# Patient Record
Sex: Female | Born: 1980 | Race: White | Hispanic: No | Marital: Married | Smoking: Never smoker
Health system: Southern US, Community
[De-identification: ages and names within clinical notes are randomized; demographics above are authoritative.]

---

## 1999-07-29 ENCOUNTER — Other Ambulatory Visit: Admission: RE | Admit: 1999-07-29 | Discharge: 1999-07-29 | Payer: Self-pay | Admitting: *Deleted

## 2000-08-05 ENCOUNTER — Other Ambulatory Visit: Admission: RE | Admit: 2000-08-05 | Discharge: 2000-08-05 | Payer: Self-pay | Admitting: Obstetrics and Gynecology

## 2003-01-10 ENCOUNTER — Other Ambulatory Visit: Admission: RE | Admit: 2003-01-10 | Discharge: 2003-01-10 | Payer: Self-pay | Admitting: Obstetrics and Gynecology

## 2016-10-07 ENCOUNTER — Other Ambulatory Visit: Payer: Self-pay | Admitting: Family Medicine

## 2016-10-07 ENCOUNTER — Ambulatory Visit
Admission: RE | Admit: 2016-10-07 | Discharge: 2016-10-07 | Disposition: A | Discharge status: Home / Self Care | Payer: BC Managed Care – PPO | Source: Ambulatory Visit | Attending: Family Medicine | Admitting: Family Medicine

## 2016-10-07 DIAGNOSIS — M79632 Pain in left forearm: Secondary | ICD-10-CM

## 2017-11-29 ENCOUNTER — Ambulatory Visit: Payer: BC Managed Care – PPO | Admitting: Psychology

## 2017-11-29 DIAGNOSIS — F411 Generalized anxiety disorder: Secondary | ICD-10-CM | POA: Diagnosis not present

## 2017-12-13 ENCOUNTER — Ambulatory Visit: Payer: BC Managed Care – PPO | Admitting: Psychology

## 2017-12-13 DIAGNOSIS — F411 Generalized anxiety disorder: Secondary | ICD-10-CM | POA: Diagnosis not present

## 2018-01-10 ENCOUNTER — Ambulatory Visit: Payer: BC Managed Care – PPO | Admitting: Psychology

## 2018-01-10 DIAGNOSIS — F411 Generalized anxiety disorder: Secondary | ICD-10-CM | POA: Diagnosis not present

## 2018-02-07 ENCOUNTER — Ambulatory Visit: Payer: BC Managed Care – PPO | Admitting: Psychology

## 2018-02-07 DIAGNOSIS — F411 Generalized anxiety disorder: Secondary | ICD-10-CM

## 2018-03-21 ENCOUNTER — Ambulatory Visit: Payer: BC Managed Care – PPO | Admitting: Psychology

## 2018-03-21 DIAGNOSIS — F411 Generalized anxiety disorder: Secondary | ICD-10-CM | POA: Diagnosis not present

## 2018-04-04 ENCOUNTER — Ambulatory Visit: Payer: BC Managed Care – PPO | Admitting: Psychology

## 2018-05-16 ENCOUNTER — Ambulatory Visit: Payer: BC Managed Care – PPO | Admitting: Psychology

## 2018-05-16 DIAGNOSIS — F411 Generalized anxiety disorder: Secondary | ICD-10-CM | POA: Diagnosis not present

## 2018-06-27 ENCOUNTER — Ambulatory Visit: Payer: BC Managed Care – PPO | Admitting: Psychology

## 2018-06-27 DIAGNOSIS — F411 Generalized anxiety disorder: Secondary | ICD-10-CM

## 2018-08-08 ENCOUNTER — Ambulatory Visit (INDEPENDENT_AMBULATORY_CARE_PROVIDER_SITE_OTHER): Payer: BC Managed Care – PPO | Admitting: Psychology

## 2018-08-08 DIAGNOSIS — F411 Generalized anxiety disorder: Secondary | ICD-10-CM | POA: Diagnosis not present

## 2018-09-06 ENCOUNTER — Ambulatory Visit (INDEPENDENT_AMBULATORY_CARE_PROVIDER_SITE_OTHER): Payer: BC Managed Care – PPO | Admitting: Psychology

## 2018-09-06 DIAGNOSIS — F411 Generalized anxiety disorder: Secondary | ICD-10-CM

## 2018-10-11 ENCOUNTER — Ambulatory Visit (INDEPENDENT_AMBULATORY_CARE_PROVIDER_SITE_OTHER): Payer: BC Managed Care – PPO | Admitting: Psychology

## 2018-10-11 DIAGNOSIS — F411 Generalized anxiety disorder: Secondary | ICD-10-CM | POA: Diagnosis not present

## 2019-04-17 ENCOUNTER — Ambulatory Visit: Payer: BC Managed Care – PPO | Attending: Internal Medicine

## 2019-04-17 DIAGNOSIS — Z20822 Contact with and (suspected) exposure to covid-19: Secondary | ICD-10-CM

## 2019-04-18 LAB — NOVEL CORONAVIRUS, NAA: SARS-CoV-2, NAA: NOT DETECTED

## 2019-06-07 ENCOUNTER — Ambulatory Visit: Payer: BC Managed Care – PPO | Attending: Internal Medicine

## 2019-06-07 DIAGNOSIS — Z20822 Contact with and (suspected) exposure to covid-19: Secondary | ICD-10-CM

## 2019-06-08 LAB — NOVEL CORONAVIRUS, NAA: SARS-CoV-2, NAA: NOT DETECTED

## 2019-06-10 ENCOUNTER — Ambulatory Visit: Payer: BC Managed Care – PPO

## 2019-06-12 ENCOUNTER — Ambulatory Visit: Payer: BC Managed Care – PPO | Attending: Internal Medicine

## 2019-06-12 DIAGNOSIS — Z20822 Contact with and (suspected) exposure to covid-19: Secondary | ICD-10-CM

## 2019-06-13 LAB — NOVEL CORONAVIRUS, NAA: SARS-CoV-2, NAA: NOT DETECTED

## 2020-10-17 ENCOUNTER — Other Ambulatory Visit: Payer: Self-pay | Admitting: Family Medicine

## 2020-10-17 DIAGNOSIS — N6320 Unspecified lump in the left breast, unspecified quadrant: Secondary | ICD-10-CM

## 2020-11-13 ENCOUNTER — Other Ambulatory Visit: Payer: Self-pay

## 2020-11-13 ENCOUNTER — Ambulatory Visit
Admission: RE | Admit: 2020-11-13 | Discharge: 2020-11-13 | Disposition: A | Discharge status: Home / Self Care | Payer: BC Managed Care – PPO | Source: Ambulatory Visit | Attending: Family Medicine | Admitting: Family Medicine

## 2020-11-13 DIAGNOSIS — N6320 Unspecified lump in the left breast, unspecified quadrant: Secondary | ICD-10-CM

## 2020-11-24 ENCOUNTER — Other Ambulatory Visit: Payer: Self-pay

## 2020-11-24 ENCOUNTER — Ambulatory Visit
Admission: EM | Admit: 2020-11-24 | Discharge: 2020-11-24 | Disposition: A | Discharge status: Home / Self Care | Payer: BC Managed Care – PPO

## 2020-11-24 DIAGNOSIS — L739 Follicular disorder, unspecified: Secondary | ICD-10-CM | POA: Diagnosis not present

## 2020-11-24 DIAGNOSIS — L080 Pyoderma: Secondary | ICD-10-CM

## 2020-11-24 NOTE — ED Triage Notes (Signed)
Pt c/o small red bumps with white spot in middle. States started approx 1 week ago on hands and has progressed to face, chest, and back. States the white spots come and go. States has tried cleansing with alcohol swabs and Claritin without relief. States traveled to [place] July 6-8 but denies contact with known monkeypox pt. Denies pain and itching.

## 2020-11-24 NOTE — ED Provider Notes (Signed)
[facility]    CSN: [id number] Arrival date & time: 11/24/20  0954      History   Chief Complaint Chief Complaint  Patient presents with   generalized bumps    HPI Kaitlyn Ross is a 40 y.o. female.   Patient presents today with a 1 week history of pustular rash.  She did have a video visit with her primary care provider who prescribed Keflex but she has not yet started this medication.  Reports symptoms are generalized and located on bilateral upper extremities, thoracic back, face.  She has been using over-the-counter washes without improvement of symptoms.  She denies history of similar symptoms in the past.  Denies any prodromal symptoms including fever, nausea, vomiting, rhinorrhea, cough.  Denies any known sick contacts with similar symptoms or exposure to any new products.  She did travel to [place] several weeks prior to symptom onset but denies any international travel.  Denies any known exposure to monkey pox.   History reviewed. No pertinent past medical history.  There are no problems to display for this patient.   History reviewed. No pertinent surgical history.  OB History   No obstetric history on file.      Home Medications    Prior to Admission medications   Not on File    Family History History reviewed. No pertinent family history.  Social History Social History   Tobacco Use   Smoking status: Never   Smokeless tobacco: Never  Substance Use Topics   Alcohol use: Never   Drug use: Never     Allergies   Bee venom   Review of Systems Review of Systems  Constitutional:  Negative for activity change, appetite change, fatigue and fever.  Respiratory:  Negative for cough and shortness of breath.   Cardiovascular:  Negative for chest pain.  Gastrointestinal:  Negative for abdominal pain, diarrhea, nausea and vomiting.  Skin:  Positive for rash.  Neurological:  Negative for dizziness, light-headedness and headaches.     Physical Exam Triage Vital Signs ED Triage Vitals [11/24/20 1123]  Enc Vitals Group     BP 106/71     Pulse Rate 89     Resp 18     Temp 98.1 F (36.7 C)     Temp Source Oral     SpO2 98 %     Weight      Height      Head Circumference      Peak Flow      Pain Score 0     Pain Loc      Pain Edu?      Excl. in GC?    No data found.  Updated Vital Signs BP 106/71 (BP Location: Right Arm)   Pulse 89   Temp 98.1 F (36.7 C) (Oral)   Resp 18   LMP 10/25/2020 (Approximate)   SpO2 98%   Visual Acuity Right Eye Distance:   Left Eye Distance:   Bilateral Distance:    Right Eye Near:   Left Eye Near:    Bilateral Near:     Physical Exam Vitals reviewed.  Constitutional:      General: She is awake. She is not in acute distress.    Appearance: Normal appearance. She is normal weight. She is not ill-appearing.     Comments: Very pleasant female appears stated age no acute distress  HENT:     Head: Normocephalic and atraumatic.  Cardiovascular:     Rate and  Rhythm: Normal rate and regular rhythm.     Heart sounds: Normal heart sounds, S1 normal and S2 normal. No murmur heard. Pulmonary:     Effort: Pulmonary effort is normal.     Breath sounds: Normal breath sounds. No wheezing, rhonchi or rales.  Skin:    General: Skin is warm.     Findings: Rash present. Rash is pustular.     Comments: Several fine pustules noted upper extremities and back.  No plaque-like lesions or vesicles noted.  No erythema or streaking indicative of lymphangitis.  Psychiatric:        Behavior: Behavior is cooperative.     UC Treatments / Results  Labs (all labs ordered are listed, but only abnormal results are displayed) Labs Reviewed - No data to display  EKG   Radiology No results found.  Procedures Procedures (including critical care time)  Medications Ordered in UC Medications - No data to display  Initial Impression / Assessment and Plan / UC Course  I have  reviewed the triage vital signs and the nursing notes.  Pertinent labs & imaging results that were available during my care of the patient were reviewed by me and considered in my medical decision making (see chart for details).      Symptoms consistent with folliculitis.  Recommend she begin antibiotics prescribed by her primary care provider.  Discussed that based on her recommendation she does not meet qualifications for state testing.  She did then inquire about vaccination I discussed that this is not something we have available and at this point is reserved for individuals with known exposure but she can contact local health department with additional questions.  Discussed that since she has no risk factors or prodromal symptoms and rashes not consistent with polyps there is no indication for additional testing.  Discussed alarm symptoms that warrant emergent evaluation.  Strict return precautions given to which patient expressed understanding. Final Clinical Impressions(s) / UC Diagnoses   Final diagnoses:  Folliculitis  Pustular rash     Discharge Instructions      Take Keflex that you are prescribed by your primary care provider.  If you develop widespread lesions or additional symptoms please return for reevaluation.  Based on your symptoms and risk factors there is no indication to test you for Monkey Pox today but if anything changes please return for reevaluation.     ED Prescriptions   None    PDMP not reviewed this encounter.   Jeani Hawking, PA-C 11/24/20 1233

## 2020-11-24 NOTE — Discharge Instructions (Addendum)
Take Keflex that you are prescribed by your primary care provider.  If you develop widespread lesions or additional symptoms please return for reevaluation.  Based on your symptoms and risk factors there is no indication to test you for Monkey Pox today but if anything changes please return for reevaluation.

## 2020-12-04 ENCOUNTER — Ambulatory Visit: Payer: BC Managed Care – PPO

## 2021-02-14 ENCOUNTER — Other Ambulatory Visit: Payer: Self-pay

## 2021-02-14 ENCOUNTER — Ambulatory Visit
Admission: RE | Admit: 2021-02-14 | Discharge: 2021-02-14 | Disposition: A | Discharge status: Home / Self Care | Payer: BC Managed Care – PPO | Source: Ambulatory Visit | Attending: Nurse Practitioner | Admitting: Nurse Practitioner

## 2021-02-14 ENCOUNTER — Other Ambulatory Visit: Payer: Self-pay | Admitting: Nurse Practitioner

## 2021-02-14 DIAGNOSIS — R0789 Other chest pain: Secondary | ICD-10-CM

## 2021-05-05 ENCOUNTER — Other Ambulatory Visit: Payer: Self-pay | Admitting: Family Medicine

## 2021-05-08 ENCOUNTER — Other Ambulatory Visit: Payer: Self-pay | Admitting: Family Medicine

## 2021-05-08 DIAGNOSIS — N6320 Unspecified lump in the left breast, unspecified quadrant: Secondary | ICD-10-CM

## 2021-06-12 ENCOUNTER — Other Ambulatory Visit: Payer: Self-pay | Admitting: Nurse Practitioner

## 2021-06-12 DIAGNOSIS — N6019 Diffuse cystic mastopathy of unspecified breast: Secondary | ICD-10-CM

## 2021-06-12 DIAGNOSIS — Z803 Family history of malignant neoplasm of breast: Secondary | ICD-10-CM

## 2021-06-12 DIAGNOSIS — N6320 Unspecified lump in the left breast, unspecified quadrant: Secondary | ICD-10-CM

## 2021-07-02 ENCOUNTER — Ambulatory Visit
Admission: RE | Admit: 2021-07-02 | Discharge: 2021-07-02 | Disposition: A | Discharge status: Home / Self Care | Payer: No Typology Code available for payment source | Source: Ambulatory Visit | Attending: Nurse Practitioner | Admitting: Nurse Practitioner

## 2021-07-02 ENCOUNTER — Other Ambulatory Visit: Payer: Self-pay

## 2021-07-02 DIAGNOSIS — Z803 Family history of malignant neoplasm of breast: Secondary | ICD-10-CM

## 2021-07-02 DIAGNOSIS — N6320 Unspecified lump in the left breast, unspecified quadrant: Secondary | ICD-10-CM

## 2021-07-02 DIAGNOSIS — N6019 Diffuse cystic mastopathy of unspecified breast: Secondary | ICD-10-CM

## 2021-07-02 MED ORDER — GADOBUTROL 1 MMOL/ML IV SOLN
6.0000 mL | Freq: Once | INTRAVENOUS | Status: AC | PRN
  Administered 2021-07-02: 6 mL via INTRAVENOUS

## 2021-11-04 ENCOUNTER — Other Ambulatory Visit: Payer: Self-pay

## 2021-11-04 ENCOUNTER — Emergency Department (HOSPITAL_BASED_OUTPATIENT_CLINIC_OR_DEPARTMENT_OTHER): Payer: BC Managed Care – PPO

## 2021-11-04 ENCOUNTER — Encounter (HOSPITAL_BASED_OUTPATIENT_CLINIC_OR_DEPARTMENT_OTHER): Payer: Self-pay | Admitting: Emergency Medicine

## 2021-11-04 ENCOUNTER — Emergency Department (HOSPITAL_BASED_OUTPATIENT_CLINIC_OR_DEPARTMENT_OTHER)
Admission: EM | Admit: 2021-11-04 | Discharge: 2021-11-04 | Disposition: A | Discharge status: Home / Self Care | Payer: BC Managed Care – PPO | Attending: Emergency Medicine | Admitting: Emergency Medicine

## 2021-11-04 DIAGNOSIS — X18XXXA Contact with other hot metals, initial encounter: Secondary | ICD-10-CM | POA: Insufficient documentation

## 2021-11-04 DIAGNOSIS — S99921A Unspecified injury of right foot, initial encounter: Secondary | ICD-10-CM

## 2021-11-04 DIAGNOSIS — S91114A Laceration without foreign body of right lesser toe(s) without damage to nail, initial encounter: Secondary | ICD-10-CM | POA: Insufficient documentation

## 2021-11-04 DIAGNOSIS — T148XXA Other injury of unspecified body region, initial encounter: Secondary | ICD-10-CM

## 2021-11-04 NOTE — ED Triage Notes (Signed)
Pt right foot was cut by a metal door at the dentist office.

## 2021-11-04 NOTE — Discharge Instructions (Signed)
Your history, exam and work-up today showed evidence of abrasion/small laceration without evidence of fracture on the imaging.  With our shared decision-making conversation, we agreed to do skin glue instead of sutures and like to manage it at home.  Please watch for signs and symptoms of infection and avoid submersion in water.  If any symptoms change or worsen acutely, please return to the nearest emergency department.

## 2021-11-04 NOTE — ED Provider Notes (Signed)
[facility] EMERGENCY DEPT Provider Note   CSN: [id number] Arrival date & time: 11/04/21  6945     History  Chief Complaint  Patient presents with   Foot Injury    Kaitlyn Ross is a 41 y.o. female.  The history is provided by the patient and medical records. No language interpreter was used.  Foot Injury Location:  Foot Time since incident:  1 hour Injury: yes   Foot location:  Dorsum of R foot Pain details:    Radiates to:  Does not radiate   Severity:  No pain   Timing:  Unable to specify   Progression:  Unable to specify Chronicity:  New Foreign body present:  Unable to specify Tetanus status:  Up to date Prior injury to area:  No Relieved by:  Nothing Worsened by:  Nothing Ineffective treatments:  None tried Associated symptoms: no back pain, no fatigue and no fever        Home Medications Prior to Admission medications   Not on File      Allergies    Bee venom    Review of Systems   Review of Systems  Constitutional:  Negative for chills, diaphoresis, fatigue and fever.  HENT:  Negative for congestion.   Respiratory:  Negative for cough, chest tightness and shortness of breath.   Cardiovascular:  Negative for chest pain.  Gastrointestinal:  Negative for diarrhea and nausea.  Genitourinary:  Negative for flank pain.  Musculoskeletal:  Negative for back pain.  Skin:  Positive for wound.  Neurological:  Negative for headaches.    Physical Exam Updated Vital Signs BP 110/70   Pulse 79   Temp 97.7 F (36.5 C) (Oral)   Resp 17   Ht 5\' 2"  (1.575 m)   Wt 56.7 kg   SpO2 100%   BMI 22.86 kg/m  Physical Exam Vitals and nursing note reviewed.  Constitutional:      General: She is not in acute distress.    Appearance: She is well-developed. She is not ill-appearing, toxic-appearing or diaphoretic.  HENT:     Head: Normocephalic and atraumatic.  Eyes:     Conjunctiva/sclera: Conjunctivae normal.  Cardiovascular:     Rate and  Rhythm: Normal rate and regular rhythm.     Heart sounds: No murmur heard. Pulmonary:     Effort: Pulmonary effort is normal. No respiratory distress.     Breath sounds: Normal breath sounds.  Abdominal:     Palpations: Abdomen is soft.     Tenderness: There is no abdominal tenderness.  Musculoskeletal:        General: Tenderness and signs of injury present. No swelling.     Cervical back: Neck supple.  Skin:    General: Skin is warm and dry.     Capillary Refill: Capillary refill takes less than 2 seconds.     Findings: No erythema or rash.  Neurological:     General: No focal deficit present.     Mental Status: She is alert.     Sensory: No sensory deficit.     Motor: No weakness.  Psychiatric:        Mood and Affect: Mood normal.     ED Results / Procedures / Treatments   Labs (all labs ordered are listed, but only abnormal results are displayed) Labs Reviewed - No data to display  EKG None  Radiology DG Foot Complete Right  Result Date: 11/04/2021 CLINICAL DATA:  Provided history: Right fifth toe injury from door  opening. Laceration to fifth toe of right foot. EXAM: RIGHT FOOT COMPLETE - 3+ VIEW COMPARISON:  No pertinent prior exams available for comparison. FINDINGS: There is normal bony alignment. No evidence of acute osseous or articular abnormality. The joint spaces are maintained. IMPRESSION: No evidence of acute osseous or articular abnormality. Electronically Signed   By: Jackey Loge D.O.   On: 11/04/2021 11:16    Procedures .Marland KitchenLaceration Repair  Date/Time: 11/04/2021 3:50 PM  Performed by: Heide Scales, MD Authorized by: Heide Scales, MD   Consent:    Consent obtained:  Verbal   Consent given by:  Patient   Risks, benefits, and alternatives were discussed: yes     Risks discussed:  Infection and pain   Alternatives discussed:  No treatment Universal protocol:    Imaging studies available: yes     Immediately prior to procedure, a  time out was called: yes     Patient identity confirmed:  Verbally with patient and hospital-assigned identification number Anesthesia:    Anesthesia method:  None Laceration details:    Location:  Toe   Toe location:  R little toe   Length (cm):  1   Depth (mm):  2 Pre-procedure details:    Preparation:  Imaging obtained to evaluate for foreign bodies Exploration:    Limited defect created (wound extended): no     Imaging outcome: foreign body not noted     Wound exploration: wound explored through full range of motion and entire depth of wound visualized     Contaminated: no   Treatment:    Area cleansed with:  Saline Skin repair:    Repair method:  Tissue adhesive Approximation:    Approximation:  Close Repair type:    Repair type:  Simple Post-procedure details:    Dressing:  Sterile dressing   Procedure completion:  Tolerated     Medications Ordered in ED Medications - No data to display  ED Course/ Medical Decision Making/ A&P                           Medical Decision Making Amount and/or Complexity of Data Reviewed Radiology: ordered.    Kaitlyn Ross is a 41 y.o. female with no significant past medical history who presents with toe injury.  According to patient, she was going to the dentist and opened a metal door to enter and it got caught on the dorsum of her right foot causing injury and laceration.  Patient quickly had it wrapped and was brought in for evaluation.  Patient says her tetanus is up-to-date and she is only complaining of mild discomfort.  She denies any other injuries or complaints today.  No preceding symptoms.  On exam, patient has a skin tear on the dorsum of the toe but the toenail appears intact.  No evidence of subungual hematoma.  Minimal tenderness.  Normal range of motion of toes.  Intact sensation and strength of the feet.  Good pulses in the DP and PT arteries.  No hip or knee tenderness.  No ankle tenderness.  Patient also has a small  laceration to the base of the fifth toe on the right foot.  It is hemostatic now and does not appear very deep.  We had x-rays obtained to the left fracture which I reviewed and interpreted did not see evidence of fracture.  No foreign body seen.  Radiology agreed there was no evidence of fracture.  We had a  shared decision-making conversation.  We offered sutures due to the location of the injury and the appearance however she would rather do skin glue due to fear of needles.  Tissue adhesive was determined to be reasonable and it was glued without any difficulty.  We removed some of the flap tissue off of the skin tear and it was dressed.  Patient will watch for signs and symptoms of infection and keep it dressed.  She will keep it in a postop shoe to prevent toe bending for the next 2 days.  She understand return precautions and follow-up instructions and was discharged in good condition.            Final Clinical Impression(s) / ED Diagnoses Final diagnoses:  Injury of toe on right foot, initial encounter  Abrasion  Laceration of lesser toe of right foot without foreign body present or damage to nail, initial encounter    Rx / DC Orders ED Discharge Orders     None      Clinical Impression: 1. Injury of toe on right foot, initial encounter   2. Abrasion   3. Laceration of lesser toe of right foot without foreign body present or damage to nail, initial encounter     Disposition: Discharge  Condition: Good  I have discussed the results, Dx and Tx plan with the pt(& family if present). He/she/they expressed understanding and agree(s) with the plan. Discharge instructions discussed at great length. Strict return precautions discussed and pt &/or family have verbalized understanding of the instructions. No further questions at time of discharge.    There are no discharge medications for this patient.   Follow Up: Hoyt Koch, MD [address] [place] [address] [phone]     [facility] [address] [place] [address] [phone]       Deavin Forst, Canary Brim, MD 11/04/21 [phone]

## 2022-03-16 ENCOUNTER — Other Ambulatory Visit: Payer: No Typology Code available for payment source

## 2022-10-12 IMAGING — MG DIGITAL DIAGNOSTIC BILAT W/ TOMO W/ CAD
6 of 12 series · 6 of 36 positions shown · non-contrast
Comparison: None.

CLINICAL DATA: 3 o'clock left breast lump

EXAM:
DIGITAL DIAGNOSTIC BILATERAL MAMMOGRAM WITH TOMOSYNTHESIS AND CAD;
ULTRASOUND LEFT BREAST LIMITED
TECHNIQUE: Bilateral digital diagnostic mammography and breast tomosynthesis
was performed. The images were evaluated with computer-aided
detection.; Targeted ultrasound examination of the left breast was
performed

[R MLO synth-2D]
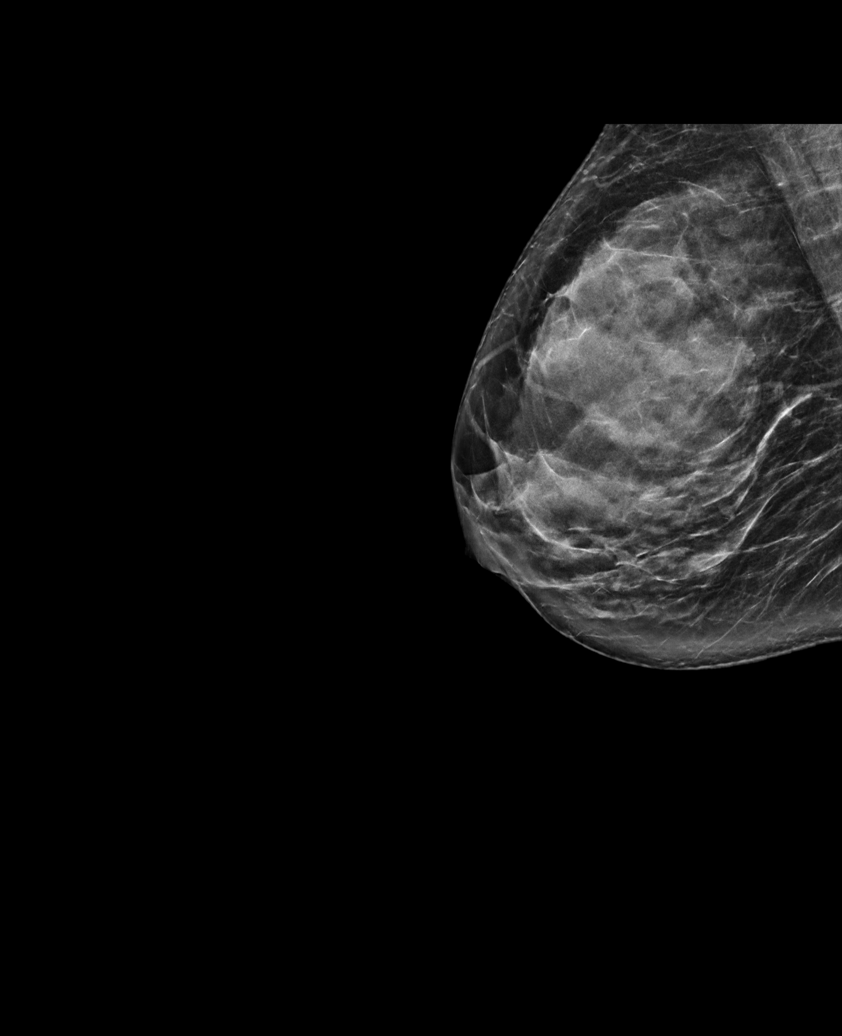

[R CC synth-2D]
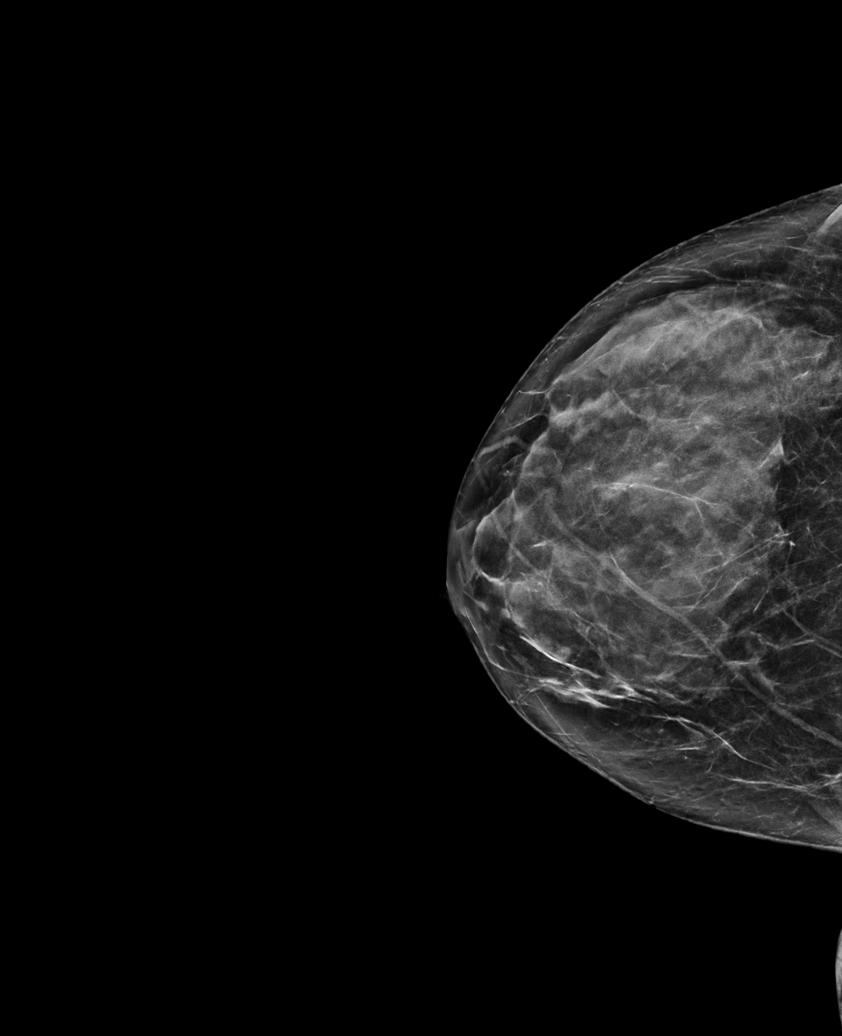

[L CC synth-2D (1 of 3)]
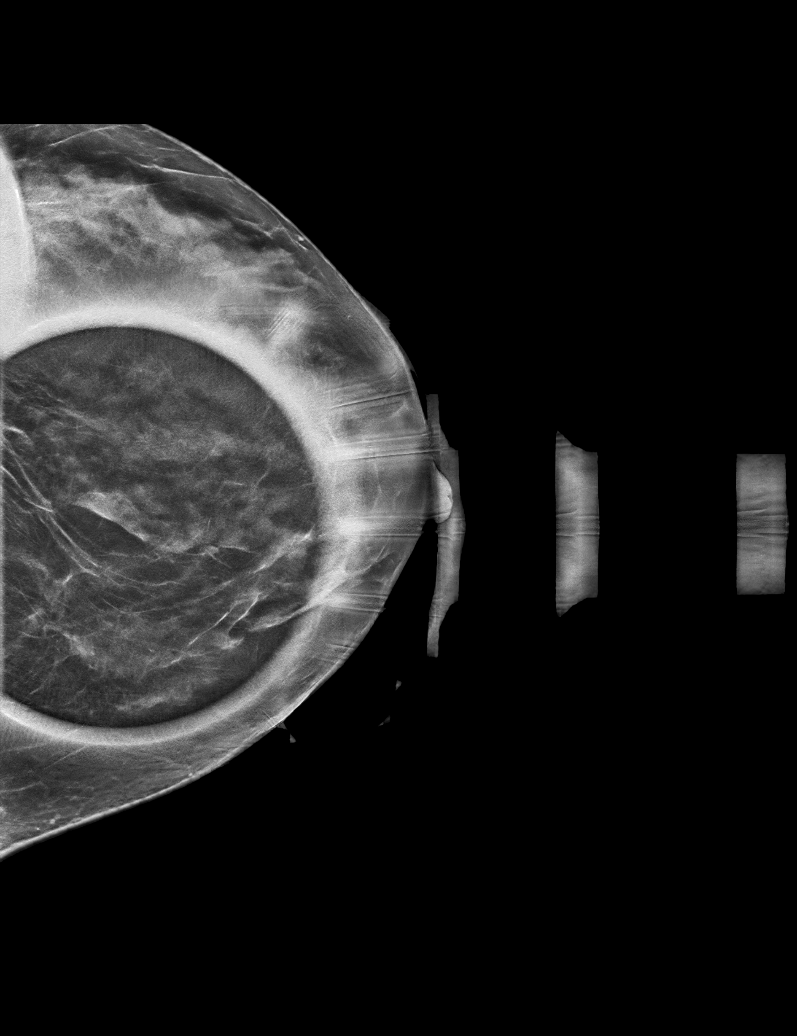

[L CC synth-2D (2 of 3)]
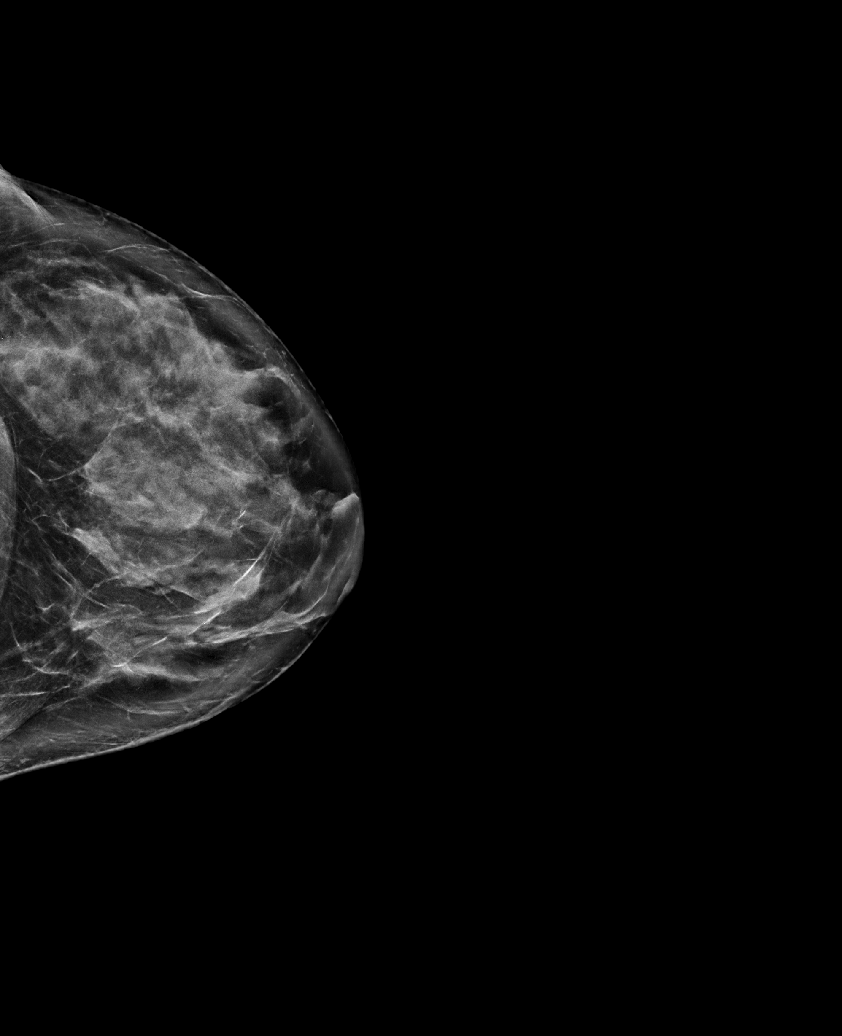

[L MLO synth-2D]
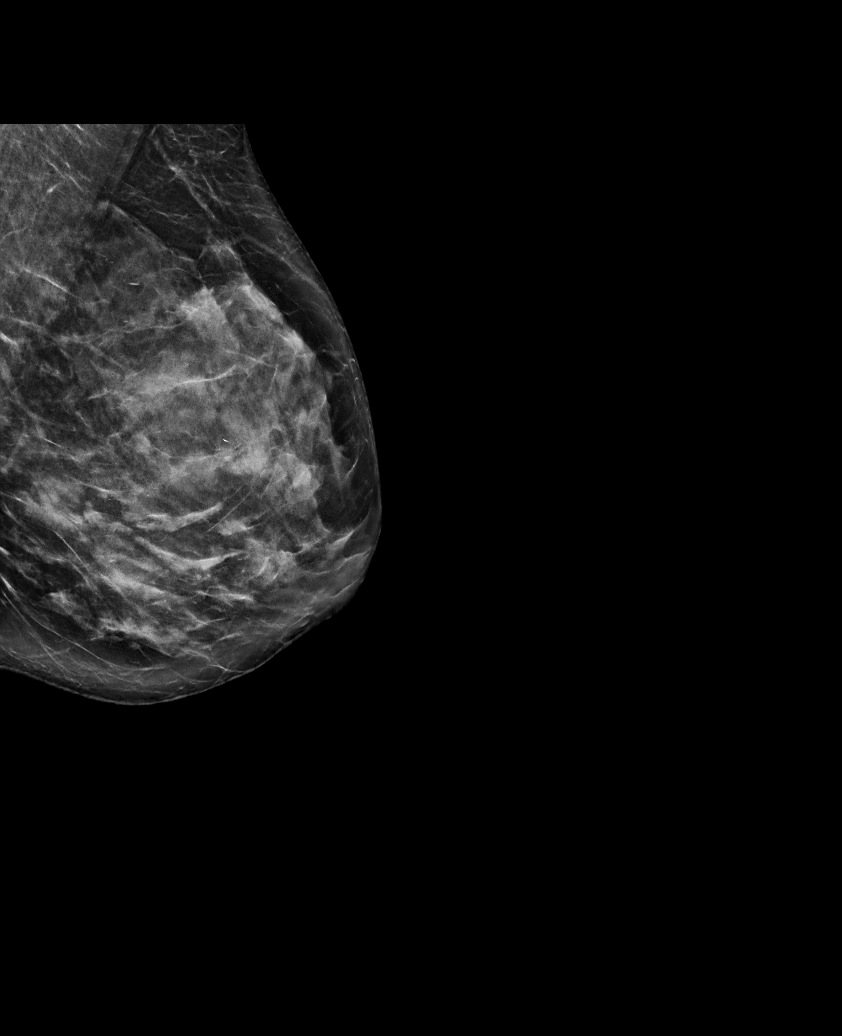

[L CC synth-2D (3 of 3)]
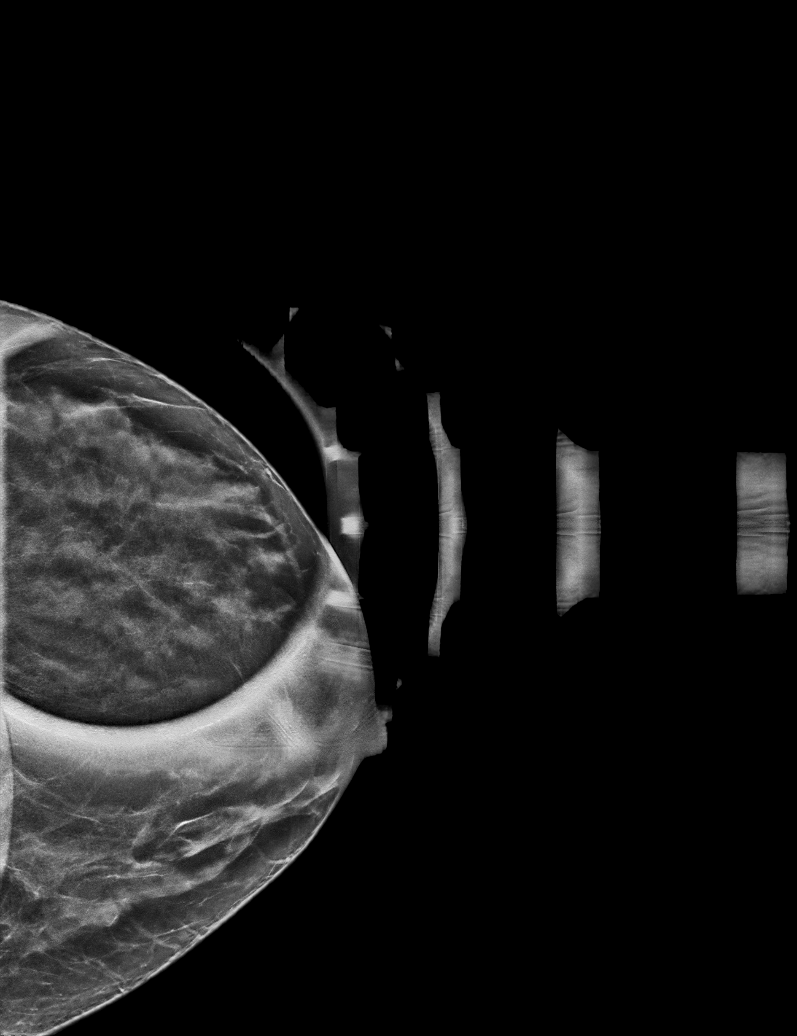

[6 of 36 positions shown; findings below may reference images not displayed]

ACR Breast Density Category c: The breast tissue is heterogeneously
dense, which may obscure small masses.
FINDINGS: There is suspicion of obscured masses in the medial left breast. No
other mammographic abnormalities are identified in either breast.

On physical exam, no suspicious lumps are identified.

Targeted ultrasound is performed, showing fibrocystic changes in the
medial left breast accounting for mammographic findings. No
suspicious findings in the region of the patient's lump at 3
o'clock.
IMPRESSION: Fibrocystic changes.  No evidence of malignancy.

RECOMMENDATION:
Treatment of the patient's lump should be based on clinical and
physical exam given lack of imaging findings. Recommend annual
screening mammography.

I have discussed the findings and recommendations with the patient.
If applicable, a reminder letter will be sent to the patient
regarding the next appointment.

BI-RADS CATEGORY  2: Benign.

## 2022-11-23 ENCOUNTER — Other Ambulatory Visit: Payer: Self-pay | Admitting: Family Medicine

## 2022-11-23 DIAGNOSIS — Z1231 Encounter for screening mammogram for malignant neoplasm of breast: Secondary | ICD-10-CM

## 2022-12-10 ENCOUNTER — Ambulatory Visit
Admission: RE | Admit: 2022-12-10 | Discharge: 2022-12-10 | Disposition: A | Discharge status: Home / Self Care | Payer: BC Managed Care – PPO | Source: Ambulatory Visit | Attending: Family Medicine | Admitting: Family Medicine

## 2022-12-10 DIAGNOSIS — Z1231 Encounter for screening mammogram for malignant neoplasm of breast: Secondary | ICD-10-CM

## 2022-12-25 DIAGNOSIS — N202 Calculus of kidney with calculus of ureter: Secondary | ICD-10-CM | POA: Insufficient documentation

## 2022-12-25 DIAGNOSIS — R319 Hematuria, unspecified: Secondary | ICD-10-CM | POA: Insufficient documentation

## 2022-12-25 DIAGNOSIS — R1031 Right lower quadrant pain: Secondary | ICD-10-CM | POA: Diagnosis present

## 2022-12-26 ENCOUNTER — Other Ambulatory Visit: Payer: Self-pay

## 2022-12-26 ENCOUNTER — Emergency Department (HOSPITAL_COMMUNITY): Payer: BC Managed Care – PPO

## 2022-12-26 ENCOUNTER — Emergency Department (HOSPITAL_COMMUNITY)
Admission: EM | Admit: 2022-12-26 | Discharge: 2022-12-26 | Disposition: A | Discharge status: Home / Self Care | Payer: BC Managed Care – PPO | Attending: Emergency Medicine | Admitting: Emergency Medicine

## 2022-12-26 ENCOUNTER — Encounter (HOSPITAL_COMMUNITY): Payer: Self-pay

## 2022-12-26 DIAGNOSIS — N201 Calculus of ureter: Secondary | ICD-10-CM

## 2022-12-26 DIAGNOSIS — N23 Unspecified renal colic: Secondary | ICD-10-CM

## 2022-12-26 LAB — URINALYSIS, ROUTINE W REFLEX MICROSCOPIC
Bilirubin Urine: NEGATIVE
Glucose, UA: NEGATIVE mg/dL
Ketones, ur: NEGATIVE mg/dL
Leukocytes,Ua: NEGATIVE
Nitrite: NEGATIVE
Protein, ur: NEGATIVE mg/dL
RBC / HPF: >50 RBC/hpf (ref 0–5)
Specific Gravity, Urine: 1.02 (ref 1.005–1.030)
pH: 6 (ref 5.0–8.0)

## 2022-12-26 LAB — COMPREHENSIVE METABOLIC PANEL
ALT: 13 U/L (ref 0–44)
AST: 18 U/L (ref 15–41)
Albumin: 4.9 g/dL (ref 3.5–5.0)
Alkaline Phosphatase: 46 U/L (ref 38–126)
Anion gap: 8 (ref 5–15)
BUN: 21 mg/dL — ABNORMAL HIGH (ref 6–20)
CO2: 25 mmol/L (ref 22–32)
Calcium: 9.6 mg/dL (ref 8.9–10.3)
Chloride: 105 mmol/L (ref 98–111)
Creatinine, Ser: 0.77 mg/dL (ref 0.44–1.00)
GFR, Estimated: >60 mL/min (ref >60)
Glucose, Bld: 94 mg/dL (ref 70–99)
Potassium: 3.2 mmol/L — ABNORMAL LOW (ref 3.5–5.1)
Sodium: 138 mmol/L (ref 135–145)
Total Bilirubin: 0.9 mg/dL (ref 0.3–1.2)
Total Protein: 8 g/dL (ref 6.5–8.1)

## 2022-12-26 LAB — HCG, SERUM, QUALITATIVE: Preg, Serum: NEGATIVE

## 2022-12-26 LAB — CBC WITH DIFFERENTIAL/PLATELET
Abs Immature Granulocytes: 0.01 10*3/uL (ref 0.00–0.07)
Basophils Absolute: 0 10*3/uL (ref 0.0–0.1)
Basophils Relative: 0 %
Eosinophils Absolute: 0.2 10*3/uL (ref 0.0–0.5)
Eosinophils Relative: 3 %
HCT: 41.4 % (ref 36.0–46.0)
Hemoglobin: 13.5 g/dL (ref 12.0–15.0)
Immature Granulocytes: 0 %
Lymphocytes Relative: 33 %
Lymphs Abs: 2.1 10*3/uL (ref 0.7–4.0)
MCH: 31.8 pg (ref 26.0–34.0)
MCHC: 32.6 g/dL (ref 30.0–36.0)
MCV: 97.6 fL (ref 80.0–100.0)
Monocytes Absolute: 0.5 10*3/uL (ref 0.1–1.0)
Monocytes Relative: 8 %
Neutro Abs: 3.4 10*3/uL (ref 1.7–7.7)
Neutrophils Relative %: 56 %
Platelets: 390 10*3/uL (ref 150–400)
RBC: 4.24 MIL/uL (ref 3.87–5.11)
RDW: 12.6 % (ref 11.5–15.5)
WBC: 6.2 10*3/uL (ref 4.0–10.5)
nRBC: 0 % (ref 0.0–0.2)

## 2022-12-26 NOTE — ED Provider Notes (Signed)
[place] EM[facility]ENCY DEPARTMENT AT [facility] Provider Note  CSN: [id number] Arrival date & time: 12/25/22 2346  Chief Complaint(s) Flank Pain  HPI Kaitlyn Ross is a 42 y.o. female     Flank Pain This is a new problem. The current episode started 6 to 12 hours ago. The problem occurs constantly. The problem has been gradually worsening. Associated symptoms include abdominal pain (RLQ). Pertinent negatives include no chest pain, no headaches and no shortness of breath. Nothing aggravates the symptoms. Nothing relieves the symptoms. She has tried nothing for the symptoms.   Pain has improved since being bedded in room.  Past Medical History History reviewed. No pertinent past medical history. There are no problems to display for this patient.  Home Medication(s) Prior to Admission medications   Medication Sig Start Date End Date Taking? Authorizing Provider  benzonatate (TESSALON) 200 MG capsule Take 200 mg by mouth 3 (three) times daily as needed. 12/18/22  Yes [provider]  fexofenadine (ALLEGRA) 180 MG tablet Take 180 mg by mouth daily as needed for allergies or rhinitis.   Yes [provider]  naproxen sodium (ALEVE) 220 MG tablet Take 220 mg by mouth daily as needed.   Yes [provider]                                                                                                                                    Allergies Bee venom  Review of Systems Review of Systems  Respiratory:  Negative for shortness of breath.   Cardiovascular:  Negative for chest pain.  Gastrointestinal:  Positive for abdominal pain (RLQ).  Genitourinary:  Positive for flank pain.  Neurological:  Negative for headaches.   As noted in HPI  Physical Exam Vital Signs  I have reviewed the triage vital signs BP (!) 148/121 (BP Location: Left Arm)   Pulse 84   Temp 97.8 F (36.6 C) (Oral)   Resp 18   Ht 5\' 2"  (1.575 m)   Wt 59 kg   LMP 12/06/2022  (Exact Date)   SpO2 100%   BMI 23.78 kg/m   Physical Exam Vitals reviewed.  Constitutional:      General: She is not in acute distress.    Appearance: She is well-developed. She is not diaphoretic.  HENT:     Head: Normocephalic and atraumatic.     Right Ear: External ear normal.     Left Ear: External ear normal.     Nose: Nose normal.  Eyes:     General: No scleral icterus.    Conjunctiva/sclera: Conjunctivae normal.  Neck:     Trachea: Phonation normal.  Cardiovascular:     Rate and Rhythm: Normal rate and regular rhythm.  Pulmonary:     Effort: Pulmonary effort is normal. No respiratory distress.     Breath sounds: No stridor.  Abdominal:     General: There is no distension.  Tenderness: There is no abdominal tenderness.  Musculoskeletal:        General: Normal range of motion.     Cervical back: Normal range of motion.  Neurological:     Mental Status: She is alert and oriented to person, place, and time.  Psychiatric:        Behavior: Behavior normal.     ED Results and Treatments Labs (all labs ordered are listed, but only abnormal results are displayed) Labs Reviewed  URINALYSIS, ROUTINE W REFLEX MICROSCOPIC - Abnormal; Notable for the following components:      Result Value   Hgb urine dipstick LARGE (*)    Bacteria, UA RARE (*)    All other components within normal limits  COMPREHENSIVE METABOLIC PANEL - Abnormal; Notable for the following components:   Potassium 3.2 (*)    BUN 21 (*)    All other components within normal limits  HCG, SERUM, QUALITATIVE  CBC WITH DIFFERENTIAL/PLATELET                                                                                                                         EKG  EKG Interpretation Date/Time:    Ventricular Rate:    PR Interval:    QRS Duration:    QT Interval:    QTC Calculation:   R Axis:      Text Interpretation:         Radiology CT Renal Stone Study  Result Date: 12/26/2022 CLINICAL  DATA:  Right-sided flank pain, stone suspected. Right groin pain radiating to back. EXAM: CT ABDOMEN AND PELVIS WITHOUT CONTRAST TECHNIQUE: Multidetector CT imaging of the abdomen and pelvis was performed following the standard protocol without IV contrast. RADIATION DOSE REDUCTION: This exam was performed according to the departmental dose-optimization program which includes automated exposure control, adjustment of the mA and/or kV according to patient size and/or use of iterative reconstruction technique. COMPARISON:  None Available. FINDINGS: Lower chest: Minimal atelectasis is present at the lung bases. Hepatobiliary: No focal liver abnormality is seen. No gallstones, gallbladder wall thickening, or biliary dilatation. Pancreas: Unremarkable. No pancreatic ductal dilatation or surrounding inflammatory changes. Spleen: Normal in size without focal abnormality. Adrenals/Urinary Tract: The adrenal glands are within normal limits. A 2 cm hypodensity is present in the left kidney with indeterminate imaging characteristics. No renal calculus or hydronephrosis bilaterally. A 3 mm calculus is present in the distal right ureter at the UVJ. The bladder is otherwise within normal limits. Stomach/Bowel: Stomach is within normal limits. Appendix appears normal. No evidence of bowel wall thickening, distention, or inflammatory changes. No free air or pneumatosis. Vascular/Lymphatic: No significant vascular findings are present. No enlarged abdominal or pelvic lymph nodes. Reproductive: Uterus and bilateral adnexa are unremarkable. Other: No abdominopelvic ascites. Small fat containing umbilical hernia is present. Musculoskeletal: No acute osseous abnormality. IMPRESSION: 1. 3 mm calculus in the distal right ureter at the UVJ. No hydroureteronephrosis bilaterally. 2. Indeterminate 2 cm hypodensity in the mid left kidney.  Nonemergent ultrasound is recommended for further evaluation on follow-up. Electronically Signed   By:  Thornell Sartorius M.D.   On: 12/26/2022 02:16    Medications Ordered in ED Medications - No data to display Procedures Procedures  (including critical care time) Medical Decision Making / ED Course   Medical Decision Making Amount and/or Complexity of Data Reviewed Labs: ordered. Radiology: ordered.    Right flank/abd pain  DDX: Renal colic, UTI/Pyelonephritis, biliary disease, appendicitis, intestinal colic, pregnancy related process. Less suspicious for ovarian torsion, but will reconsider if other w/u is negative.  CBC w/o leukocytosis or anemia CMP without significant electrolyte derangements or renal sufficiency.  No bili obstruction. hCG negative UA with hematuria.  Not concerning for infection. CT stone study with right 3mm UVJ stone Pain controlled w/o intervention     Final Clinical Impression(s) / ED Diagnoses Final diagnoses:  Ureterolithiasis  Ureteral colic   The patient appears reasonably screened and/or stabilized for discharge and I doubt any other medical condition or other [facility] requiring further screening, evaluation, or treatment in the ED at this time. I have discussed the findings, Dx and Tx plan with the patient/family who expressed understanding and agree(s) with the plan. Discharge instructions discussed at length. The patient/family was given strict return precautions who verbalized understanding of the instructions. No further questions at time of discharge.  Disposition: Discharge  Condition: Good  ED Discharge Orders     None        Follow Up: Lewis Moccasin, MD [address] [place] [address] [phone]  Call  to schedule an appointment for close follow up    This chart was dictated using voice recognition software.  Despite best efforts to proofread,  errors can occur which can change the documentation meaning.    Nira Conn, MD 12/26/22 [phone]

## 2022-12-26 NOTE — ED Triage Notes (Signed)
Right sided flank pain beginning this afternoon. Reports nausea earlier but has since resolved.   Pain began in groin and radiates to the back.

## 2022-12-26 NOTE — ED Notes (Signed)
Patient transported to CT 

## 2022-12-26 NOTE — Discharge Instructions (Addendum)
For pain control you may take 1000 mg of acetaminophen (Tylenol) every 8 hours and/or 600 mg of Ibuprofen (Motrin, Advil, etc.) every 6-8 hours as needed.  Please limit acetaminophen (Tylenol) to 4000 mg and Ibuprofen (Motrin, Advil, etc.) to 2400 mg for a 24hr period. Please note that other over-the-counter medicine may contain acetaminophen or ibuprofen as a component of their ingredients.    During the workup we noted incidental findings on your imaging that would require you to follow-up with your regular doctor for further evaluation/management:  Indeterminate 2 cm hypodensity in the mid left kidney.  Nonemergent ultrasound is recommended for further evaluation on  follow-u

## 2023-01-06 ENCOUNTER — Other Ambulatory Visit: Payer: Self-pay | Admitting: Nurse Practitioner

## 2023-01-06 DIAGNOSIS — N289 Disorder of kidney and ureter, unspecified: Secondary | ICD-10-CM

## 2023-01-13 ENCOUNTER — Ambulatory Visit
Admission: RE | Admit: 2023-01-13 | Discharge: 2023-01-13 | Disposition: A | Discharge status: Home / Self Care | Payer: BC Managed Care – PPO | Source: Ambulatory Visit | Attending: Nurse Practitioner | Admitting: Nurse Practitioner

## 2023-01-13 DIAGNOSIS — N289 Disorder of kidney and ureter, unspecified: Secondary | ICD-10-CM

## 2023-11-10 ENCOUNTER — Other Ambulatory Visit: Payer: Self-pay

## 2023-11-10 ENCOUNTER — Ambulatory Visit
Admission: RE | Admit: 2023-11-10 | Discharge: 2023-11-10 | Disposition: A | Discharge status: Home / Self Care | Source: Ambulatory Visit | Attending: Physician Assistant | Admitting: Physician Assistant

## 2023-11-10 VITALS — BP 113/73 | HR 78 | Temp 98.2°F | Resp 19 | Ht 62.0 in | Wt 132.4 lb

## 2023-11-10 DIAGNOSIS — Z203 Contact with and (suspected) exposure to rabies: Secondary | ICD-10-CM

## 2023-11-10 MED ORDER — RABIES IMMUNE GLOBULIN 300 UNIT/2ML IJ SOLN
20.0000 [IU]/kg | Freq: Once | INTRAMUSCULAR | Status: AC
  Administered 2023-11-10: 1200 [IU] via INTRAMUSCULAR

## 2023-11-10 MED ORDER — RABIES VACCINE, PCEC IM SUSR
1.0000 mL | Freq: Once | INTRAMUSCULAR | Status: AC
  Administered 2023-11-10: 1 mL via INTRAMUSCULAR

## 2023-11-10 NOTE — ED Triage Notes (Signed)
 Pt presents to urgent care unsure if she is needing rabies series. States she has had issues of bats coming into her home from the [place]. This issue is now fixed. Pt states she has not came into contact with a bat that she knows of. Has not noticed any bite marks. Currently denies pain, feels like her normal self.

## 2023-11-10 NOTE — Discharge Instructions (Addendum)
 You were seen today for concerns of potential rabies exposure due to bats in your home. We have administered the rabies vaccine  as well as rabies immune globulin  to assist with prevention of rabies since we are not sure of your exact exposure risk.   In addition to the human rabies immune globulin  (HRIG), you were given the first dose of the rabies vaccine  today (Day 0).  Please return here on the following dates to complete the rabies series: Day 3: 11/13/23 Day 7: 11/17/23 Day 14:11/24/23

## 2023-11-10 NOTE — ED Provider Notes (Signed)
 [facility]    CSN: [id number] Arrival date & time: 11/10/23  1320      History   Chief Complaint Chief Complaint  Patient presents with   Rabies Vaccine      HPI Kaitlyn Ross is a 43 y.o. female.   HPI  Pt reports concerns for potential exposure to bats in her home  She state there were multiple bats present and she did get at least one picked up and tested by [employer] but results were inconclusive. She denies known bites or scratches but is not sure since they were there for several days and there were multiple bats present  She first noticed the bats on 11/01/23 and final one was removed on 11/06/23   History reviewed. No pertinent past medical history.  There are no active problems to display for this patient.   History reviewed. No pertinent surgical history.  OB History   No obstetric history on file.      Home Medications    Prior to Admission medications   Medication Sig Start Date End Date Taking? Authorizing Provider  benzonatate (TESSALON) 200 MG capsule Take 200 mg by mouth 3 (three) times daily as needed. 12/18/22   [provider]  fexofenadine (ALLEGRA) 180 MG tablet Take 180 mg by mouth daily as needed for allergies or rhinitis.    [provider]  naproxen sodium (ALEVE) 220 MG tablet Take 220 mg by mouth daily as needed.    [provider]    Family History History reviewed. No pertinent family history.  Social History Social History   Tobacco Use   Smoking status: Never   Smokeless tobacco: Never  Vaping Use   Vaping status: Never Used  Substance Use Topics   Alcohol use: Never   Drug use: Never     Allergies   Bee venom   Review of Systems Review of Systems   Physical Exam Triage Vital Signs ED Triage Vitals  Encounter Vitals Group     BP 11/10/23 1344 113/73     Girls Systolic BP Percentile --      Girls Diastolic BP Percentile --      Boys Systolic BP Percentile --      Boys  Diastolic BP Percentile --      Pulse Rate 11/10/23 1344 78     Resp 11/10/23 1344 19     Temp 11/10/23 1344 98.2 F (36.8 C)     Temp Source 11/10/23 1344 Oral     SpO2 11/10/23 1344 99 %     Weight 11/10/23 1345 130 lb (59 kg)     Height 11/10/23 1345 5' 2 (1.575 m)     Head Circumference --      Peak Flow --      Pain Score 11/10/23 1345 0     Pain Loc --      Pain Education --      Exclude from Growth Chart --    No data found.  Updated Vital Signs BP 113/73 (BP Location: Right Arm)   Pulse 78   Temp 98.2 F (36.8 C) (Oral)   Resp 19   Ht 5' 2 (1.575 m)   Wt 132 lb 6.4 oz (60.1 kg)   SpO2 99%   BMI 24.22 kg/m   Visual Acuity Right Eye Distance:   Left Eye Distance:   Bilateral Distance:    Right Eye Near:   Left Eye Near:    Bilateral Near:  Physical Exam Vitals reviewed.  Constitutional:      General: She is awake. She is not in acute distress.    Appearance: Normal appearance. She is well-developed and well-groomed. She is not ill-appearing or toxic-appearing.  HENT:     Head: Normocephalic and atraumatic.  Eyes:     General: Lids are normal. Gaze aligned appropriately.     Extraocular Movements: Extraocular movements intact.     Conjunctiva/sclera: Conjunctivae normal.  Pulmonary:     Effort: Pulmonary effort is normal.  Skin:    General: Skin is warm and dry.     Findings: No laceration, lesion, rash or wound.  Neurological:     Mental Status: She is alert and oriented to person, place, and time.  Psychiatric:        Attention and Perception: Attention and perception normal.        Mood and Affect: Mood and affect normal.        Speech: Speech normal.        Behavior: Behavior normal. Behavior is cooperative.      UC Treatments / Results  Labs (all labs ordered are listed, but only abnormal results are displayed) Labs Reviewed - No data to display  EKG   Radiology No results found.  Procedures Procedures (including critical  care time)  Medications Ordered in UC Medications  rabies immune globulin  (HYPERRAB) injection 1,200 Units (1,200 Units Intramuscular Given 11/10/23 1414)  rabies vaccine  (RABAVERT ) injection 1 mL (1 mL Intramuscular Given 11/10/23 1412)    Initial Impression / Assessment and Plan / UC Course  I have reviewed the triage vital signs and the nursing notes.  Pertinent labs & imaging results that were available during my care of the patient were reviewed by me and considered in my medical decision making (see chart for details).      Final Clinical Impressions(s) / UC Diagnoses   Final diagnoses:  Contact with and suspected exposure to rabies  Need for post exposure prophylaxis for rabies   Pt presents today for concerns of potential exposure to rabies from bats that were in her home. Bats were first noted on 11/01/23 and she had several removed professionally but others were still present until 11/09/23. She reports that [employer] was contacted and removed a dead bat from the sink but testing was inconclusive. She is amenable to post exposure rabies vaccine  and immune globulin  administration today. Reviewed need for follow up vaccination on days 3,7,14. Follow up as directed.     Discharge Instructions      You were seen today for concerns of potential rabies exposure due to bats in your home. We have administered the rabies vaccine  as well as rabies immune globulin  to assist with prevention of rabies since we are not sure of your exact exposure risk.   In addition to the human rabies immune globulin  (HRIG), you were given the first dose of the rabies vaccine  today (Day 0).  Please return here on the following dates to complete the rabies series: Day 3: 11/13/23 Day 7: 11/17/23 Day 14:11/24/23       ED Prescriptions   None    PDMP not reviewed this encounter.   Marylene Rocky BRAVO, PA-C 11/10/23 1415

## 2023-11-14 ENCOUNTER — Ambulatory Visit
Admission: RE | Admit: 2023-11-14 | Discharge: 2023-11-14 | Disposition: A | Discharge status: Home / Self Care | Source: Ambulatory Visit | Attending: Family Medicine | Admitting: Family Medicine

## 2023-11-14 DIAGNOSIS — Z203 Contact with and (suspected) exposure to rabies: Secondary | ICD-10-CM

## 2023-11-14 MED ORDER — RABIES VACCINE, PCEC IM SUSR
1.0000 mL | Freq: Once | INTRAMUSCULAR | Status: AC
  Administered 2023-11-14: 1 mL via INTRAMUSCULAR

## 2023-11-14 NOTE — ED Triage Notes (Addendum)
 Pt presents for 2nd rabies vaccine . Denies any complications with previous vaccine.  2nd vaccine given in right arm

## 2023-11-18 ENCOUNTER — Ambulatory Visit
Admission: RE | Admit: 2023-11-18 | Discharge: 2023-11-18 | Disposition: A | Discharge status: Home / Self Care | Source: Ambulatory Visit | Attending: Family Medicine | Admitting: Family Medicine

## 2023-11-18 DIAGNOSIS — Z203 Contact with and (suspected) exposure to rabies: Secondary | ICD-10-CM

## 2023-11-18 MED ORDER — RABIES VACCINE, PCEC IM SUSR
1.0000 mL | Freq: Once | INTRAMUSCULAR | Status: AC
  Administered 2023-11-18: 1 mL via INTRAMUSCULAR

## 2023-11-18 NOTE — ED Triage Notes (Signed)
 Pt presents for 3rd rabies vaccine . Denies any complications.  3rd vaccine given in left arm

## 2023-11-25 ENCOUNTER — Ambulatory Visit
Admission: RE | Admit: 2023-11-25 | Discharge: 2023-11-25 | Disposition: A | Discharge status: Home / Self Care | Attending: Physician Assistant | Admitting: Physician Assistant

## 2023-11-25 ENCOUNTER — Other Ambulatory Visit: Payer: Self-pay

## 2023-11-25 DIAGNOSIS — Z203 Contact with and (suspected) exposure to rabies: Secondary | ICD-10-CM | POA: Diagnosis not present

## 2023-11-25 MED ORDER — RABIES VACCINE, PCEC IM SUSR
1.0000 mL | Freq: Once | INTRAMUSCULAR | Status: AC
  Administered 2023-11-25: 1 mL via INTRAMUSCULAR

## 2023-11-25 NOTE — ED Triage Notes (Signed)
 Pt presents to urgent care for follow-up rabies vaccine . Denies complications from previous vaccines administered. Currently denies pain. Received last Rabies vaccine  in left deltoid on 7/24.
